# Patient Record
Sex: Male | Born: 1995 | Race: White | Hispanic: No | Marital: Single | Smoking: Never smoker
Health system: Southern US, Community
[De-identification: ages and names within clinical notes are randomized; demographics above are authoritative.]

## PROBLEM LIST (undated history)

## (undated) HISTORY — PX: MANDIBLE SURGERY: SHX707

---
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
## 2020-08-12 ENCOUNTER — Encounter: Payer: Self-pay | Admitting: Physician Assistant

## 2020-09-10 ENCOUNTER — Other Ambulatory Visit: Payer: Self-pay

## 2020-09-10 ENCOUNTER — Ambulatory Visit: Payer: Managed Care, Other (non HMO) | Admitting: Physician Assistant

## 2020-09-10 ENCOUNTER — Encounter: Payer: Self-pay | Admitting: Physician Assistant

## 2020-09-10 VITALS — BP 134/76 | HR 72 | Temp 98.0°F | Resp 15 | Ht 64.0 in | Wt 144.0 lb

## 2020-09-10 DIAGNOSIS — Z008 Encounter for other general examination: Secondary | ICD-10-CM | POA: Diagnosis not present

## 2020-09-10 DIAGNOSIS — Z0000 Encounter for general adult medical examination without abnormal findings: Secondary | ICD-10-CM

## 2020-09-10 NOTE — Progress Notes (Signed)
° °  Subjective:    Patient ID: Joshua Crosby, male    DOB: 10/10/1996, 24 y.o.   MRN: [id number]  Pt is a 24 y/o male presents to clinic for biometric exam. He works with the [employer] he has been working in this position for over 1 year. He denies any medical problem. He exercises regularly. No specific diet plan. No recent illnesses, and no past medical problems. He is up to date on immunizations. He has not taken COVID vaccines. No primary MD. Leanora Ivanoff no meds.     Review of Systems  Constitutional: Negative for activity change, appetite change and fatigue.  HENT: Negative.   Eyes: Negative.   Respiratory: Negative.   Cardiovascular: Negative.   Gastrointestinal: Negative.   Musculoskeletal: Negative.   Neurological: Negative.   Psychiatric/Behavioral: Negative.        Objective:   Physical Exam Constitutional:      General: He is not in acute distress.    Appearance: Normal appearance.  HENT:     Head: Normocephalic.     Right Ear: Tympanic membrane and ear canal normal.     Left Ear: Tympanic membrane and ear canal normal.     Mouth/Throat:     Mouth: Mucous membranes are moist.     Pharynx: Oropharynx is clear.  Eyes:     Conjunctiva/sclera: Conjunctivae normal.     Pupils: Pupils are equal, round, and reactive to light.  Cardiovascular:     Rate and Rhythm: Normal rate and regular rhythm.     Pulses: Normal pulses.     Heart sounds: Normal heart sounds.  Pulmonary:     Effort: Pulmonary effort is normal.     Breath sounds: Normal breath sounds.  Abdominal:     General: Bowel sounds are normal.  Musculoskeletal:        General: Normal range of motion.     Cervical back: Normal range of motion and neck supple. No tenderness.  Skin:    General: Skin is warm and dry.  Neurological:     General: No focal deficit present.     Mental Status: He is alert.  Psychiatric:        Mood and Affect: Mood normal.           Assessment & Plan:

## 2020-09-10 NOTE — Patient Instructions (Signed)
Please return to clinic for biometric exam in 1 year.

## 2020-09-11 LAB — GLUCOSE, RANDOM: Glucose: 95 mg/dL (ref 65–99)

## 2020-09-11 LAB — LIPID PANEL
Chol/HDL Ratio: 3.6 ratio (ref 0.0–5.0)
Cholesterol, Total: 141 mg/dL (ref 100–199)
HDL: 39 mg/dL — ABNORMAL LOW (ref >39)
LDL Chol Calc (NIH): 86 mg/dL (ref 0–99)
Triglycerides: 83 mg/dL (ref 0–149)
VLDL Cholesterol Cal: 16 mg/dL (ref 5–40)

## 2020-10-01 ENCOUNTER — Encounter: Payer: Self-pay | Admitting: Radiology

## 2020-10-01 ENCOUNTER — Other Ambulatory Visit: Payer: Self-pay

## 2020-10-01 ENCOUNTER — Emergency Department
Admission: EM | Admit: 2020-10-01 | Discharge: 2020-10-01 | Disposition: A | Discharge status: Home / Self Care | Payer: No Typology Code available for payment source | Attending: Emergency Medicine | Admitting: Emergency Medicine

## 2020-10-01 ENCOUNTER — Emergency Department: Payer: No Typology Code available for payment source

## 2020-10-01 DIAGNOSIS — S60511A Abrasion of right hand, initial encounter: Secondary | ICD-10-CM | POA: Insufficient documentation

## 2020-10-01 DIAGNOSIS — S7012XA Contusion of left thigh, initial encounter: Secondary | ICD-10-CM | POA: Insufficient documentation

## 2020-10-01 DIAGNOSIS — S60921A Unspecified superficial injury of right hand, initial encounter: Secondary | ICD-10-CM | POA: Diagnosis present

## 2020-10-01 DIAGNOSIS — T1490XA Injury, unspecified, initial encounter: Secondary | ICD-10-CM

## 2020-10-01 DIAGNOSIS — S60512A Abrasion of left hand, initial encounter: Secondary | ICD-10-CM | POA: Diagnosis not present

## 2020-10-01 DIAGNOSIS — S7011XA Contusion of right thigh, initial encounter: Secondary | ICD-10-CM | POA: Insufficient documentation

## 2020-10-01 DIAGNOSIS — R519 Headache, unspecified: Secondary | ICD-10-CM | POA: Diagnosis not present

## 2020-10-01 MED ORDER — IOHEXOL 300 MG/ML  SOLN
100.0000 mL | Freq: Once | INTRAMUSCULAR | Status: AC | PRN
  Administered 2020-10-01: 100 mL via INTRAVENOUS

## 2020-10-01 MED ORDER — IBUPROFEN 800 MG PO TABS
800.0000 mg | ORAL_TABLET | Freq: Once | ORAL | Status: AC
  Administered 2020-10-01: 800 mg via ORAL
  Filled 2020-10-01: qty 1

## 2020-10-01 MED ORDER — IBUPROFEN 800 MG PO TABS: 800.0000 mg | ORAL_TABLET | Freq: Three times a day (TID) | ORAL | 0 refills | Status: AC | PRN

## 2020-10-01 MED ORDER — CYCLOBENZAPRINE HCL 10 MG PO TABS
10.0000 mg | ORAL_TABLET | Freq: Once | ORAL | Status: AC
  Administered 2020-10-01: 10 mg via ORAL
  Filled 2020-10-01: qty 1

## 2020-10-01 MED ORDER — ACETAMINOPHEN 500 MG PO TABS
1000.0000 mg | ORAL_TABLET | Freq: Once | ORAL | Status: AC
  Administered 2020-10-01: 1000 mg via ORAL
  Filled 2020-10-01: qty 2

## 2020-10-01 MED ORDER — IBUPROFEN 600 MG PO TABS: 600.0000 mg | ORAL_TABLET | Freq: Once | ORAL | Status: DC

## 2020-10-01 MED ORDER — CYCLOBENZAPRINE HCL 10 MG PO TABS: 10.0000 mg | ORAL_TABLET | Freq: Three times a day (TID) | ORAL | 0 refills | Status: AC | PRN
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
## 2020-10-01 NOTE — ED Notes (Signed)
Assumed care of pt, pt denies complaints at this time. Stands independently, denies lightheaded or dizziness. AO x4. Talking in full sentences with regular and unlabored breathing
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
## 2021-06-22 ENCOUNTER — Ambulatory Visit: Payer: Managed Care, Other (non HMO) | Admitting: Physician Assistant

## 2021-06-22 ENCOUNTER — Encounter: Payer: Self-pay | Admitting: Physician Assistant

## 2021-06-22 VITALS — BP 122/72 | HR 84 | Temp 98.4°F | Resp 12 | Ht 71.0 in | Wt 145.0 lb

## 2021-06-22 DIAGNOSIS — Z008 Encounter for other general examination: Secondary | ICD-10-CM

## 2021-06-22 DIAGNOSIS — Z0000 Encounter for general adult medical examination without abnormal findings: Secondary | ICD-10-CM

## 2021-06-22 NOTE — Progress Notes (Deleted)
   Subjective:    Patient ID: Joshua Crosby, male    DOB: Jun 10, 1996, 25 y.o.   MRN: [id number]  HPI    Review of Systems     Objective:   Physical Exam        Assessment & Plan:

## 2021-06-22 NOTE — Progress Notes (Addendum)
Subjective:    Patient ID: Joshua Crosby, male    DOB: 1996/09/15, 25 y.o.   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  Enjoys chosen profession Born and raised in this area Graduated from [employer] in 2019.  Has just gone through 2- 3 years of extensive  [place] maxillo-mandibular facial reconstruction. 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 He has done very well though the post operative course was understandably very difficult - he is pleased with the outcome. Has had years of orthodontic preparation prior to reconstructive surgery Has another 4-5 months of full braces and will be able to get to a regular diet.  Excited about a real steak dinner ! Has multiple facial plates and screws that will be left in situ by his report. Sings praises of the medical surgical teams at [facility] who were very supportive as well as capable, professional , personable.  Has not had Covid vaccines, doesn't plan to Has not had flu vaccines by history  Does not have a PCP- encouraged to check with family about affiliation and then to make an association at office of choice for future care.  Review of Systems Has 2 younger brothers 76 and 79 - no others with similar concerns   Denies allergies Objective:   Physical Exam Nursing note (98.4    12   84) reviewed.  Constitutional:      Appearance: Normal appearance. He is normal weight.     Comments: Currently thin, looking forward to returning to regular diet No visual clues to the extensive surgery he has been through- smile and naso labial folds are symmetric- obviously pleased with results after long process.  HENT:     Head: Normocephalic and atraumatic.     Right Ear: Tympanic membrane, ear canal and external ear normal.     Left Ear: Tympanic membrane, ear canal and external ear normal.     Ears:      Comments: Uses Qtips regularly    Nose: Nose normal.     Mouth/Throat:     Mouth: Mucous membranes are moist.     Comments: Full braces up/down Eyes:     Extraocular Movements: Extraocular movements intact.     Conjunctiva/sclera: Conjunctivae normal.  Cardiovascular:     Rate and Rhythm: Normal rate and regular rhythm.     Pulses: Normal pulses.  Pulmonary:     Effort: Pulmonary effort is normal.     Breath sounds: Normal breath sounds.  Abdominal:     General: Abdomen is flat.     Palpations: Abdomen is soft.  Genitourinary:    Comments: defer Musculoskeletal:        General: Normal range of motion.     Cervical back: Normal range of motion and neck supple.  Skin:    General: Skin is warm and dry.     Capillary Refill: Capillary refill takes less than 2 seconds.  Neurological:     Mental Status: He is alert and oriented to person, place, and time. Mental status is at baseline.     Cranial Nerves: No cranial nerve deficit.     Deep Tendon Reflexes: Reflexes normal.     Comments: Some minimal numbness persists peri-orally , left Function is grossly symmetrical  Psychiatric:        Mood and Affect: Mood normal.  Behavior: Behavior normal.      Assessment & Plan:  Biometric screening and labs- results pending- will be reported on MyChart Pt reports active Questions fielded Does not appear to have tetanus vaccine hx- check outside records   07/17/21 My Chart was pending- not active Many attempts to reach him by phone have been unsuccessful His mailbox is full and phone can't take message Have sent a letter reviewing lab findings and making dietary change suggestions. Has been encouraged to establish with PCP as clinic cannot manage chronic care [facility] PA-C

## 2021-06-23 LAB — LIPID PANEL
Chol/HDL Ratio: 3.9 ratio (ref 0.0–5.0)
Cholesterol, Total: 182 mg/dL (ref 100–199)
HDL: 47 mg/dL (ref >39)
LDL Chol Calc (NIH): 119 mg/dL — ABNORMAL HIGH (ref 0–99)
Triglycerides: 86 mg/dL (ref 0–149)
VLDL Cholesterol Cal: 16 mg/dL (ref 5–40)

## 2021-06-23 LAB — GLUCOSE, RANDOM: Glucose: 95 mg/dL (ref 65–99)
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
## 2023-06-30 ENCOUNTER — Ambulatory Visit
Admission: EM | Admit: 2023-06-30 | Discharge: 2023-06-30 | Disposition: A | Discharge status: Home / Self Care | Payer: Managed Care, Other (non HMO) | Attending: Emergency Medicine | Admitting: Emergency Medicine

## 2023-06-30 DIAGNOSIS — T63481A Toxic effect of venom of other arthropod, accidental (unintentional), initial encounter: Secondary | ICD-10-CM

## 2023-06-30 DIAGNOSIS — M25471 Effusion, right ankle: Secondary | ICD-10-CM | POA: Diagnosis not present

## 2023-06-30 DIAGNOSIS — T7840XA Allergy, unspecified, initial encounter: Secondary | ICD-10-CM | POA: Diagnosis not present

## 2023-06-30 MED ORDER — PREDNISONE 10 MG (21) PO TBPK: ORAL_TABLET | Freq: Every day | ORAL | 0 refills | Status: AC

## 2023-06-30 NOTE — ED Provider Notes (Signed)
Joshua Crosby    CSN: [id number] Arrival date & time: 06/30/23  0858      History   Chief Complaint Chief Complaint  Patient presents with   Insect Bite    HPI Joshua Crosby is a 27 y.o. male.  Patient presents with a hornet sting on his right anterior ankle that occurred 4 days ago.  The area is red and swollen but has improved today.  No OTC medications taken today but he took Benadryl last night.  No difficulty swallowing or breathing.  No fever, chills, or other symptoms.  No pertinent medical history.  The history is provided by the patient and medical records.    History reviewed. No pertinent past medical history.  There are no problems to display for this patient.   Past Surgical History:  Procedure Laterality Date   MANDIBLE SURGERY         Home Medications    Prior to Admission medications   Medication Sig Start Date End Date Taking? Authorizing Provider  predniSONE (STERAPRED UNI-PAK 21 TAB) 10 MG (21) TBPK tablet Take by mouth daily. As directed 06/30/23  Yes Mickie Bail, NP  cyclobenzaprine (FLEXERIL) 10 MG tablet Take 1 tablet (10 mg total) by mouth 3 (three) times daily as needed for muscle spasms. Patient not taking: Reported on 06/22/2021 10/01/20   Nita Sickle, MD  ibuprofen (ADVIL) 800 MG tablet Take 1 tablet (800 mg total) by mouth every 8 (eight) hours as needed. Patient not taking: Reported on 06/30/2023 10/01/20   Nita Sickle, MD    Family History History reviewed. No pertinent family history.  Social History Social History   Tobacco Use   Smoking status: Never   Smokeless tobacco: Never  Vaping Use   Vaping Use: Never used  Substance Use Topics   Alcohol use: Yes   Drug use: Never     Allergies   Patient has no known allergies.   Review of Systems Review of Systems  Constitutional:  Negative for chills and fever.  HENT:  Negative for sore throat, trouble swallowing and voice change.   Respiratory:  Negative  for cough and shortness of breath.   Cardiovascular:  Negative for chest pain and palpitations.  Musculoskeletal:  Positive for joint swelling. Negative for arthralgias.  Skin:  Positive for color change and wound.  Neurological:  Negative for weakness and numbness.  All other systems reviewed and are negative.    Physical Exam Triage Vital Signs ED Triage Vitals  Enc Vitals Group     BP --      Pulse Rate 06/30/23 0904 90     Resp 06/30/23 0904 18     Temp 06/30/23 0904 97.7 F (36.5 C)     Temp src --      SpO2 06/30/23 0904 97 %     Weight --      Height --      Head Circumference --      Peak Flow --      Pain Score 06/30/23 0916 0     Pain Loc --      Pain Edu? --      Excl. in GC? --    No data found.  Updated Vital Signs BP 117/73   Pulse 90   Temp 97.7 F (36.5 C)   Resp 18   SpO2 97%   Visual Acuity Right Eye Distance:   Left Eye Distance:   Bilateral Distance:    Right Eye  Near:   Left Eye Near:    Bilateral Near:     Physical Exam Vitals and nursing note reviewed.  Constitutional:      General: He is not in acute distress.    Appearance: He is well-developed.  HENT:     Mouth/Throat:     Mouth: Mucous membranes are moist.     Pharynx: Oropharynx is clear.  Cardiovascular:     Rate and Rhythm: Normal rate and regular rhythm.     Heart sounds: Normal heart sounds.  Pulmonary:     Effort: Pulmonary effort is normal. No respiratory distress.     Breath sounds: Normal breath sounds.  Musculoskeletal:        General: Swelling present. No deformity. Normal range of motion.     Cervical back: Neck supple.       Feet:  Skin:    General: Skin is warm and dry.     Capillary Refill: Capillary refill takes less than 2 seconds.     Findings: Erythema and lesion present.  Neurological:     General: No focal deficit present.     Mental Status: He is alert and oriented to person, place, and time.     Sensory: No sensory deficit.     Motor: No  weakness.     Gait: Gait normal.  Psychiatric:        Mood and Affect: Mood normal.        Behavior: Behavior normal.      UC Treatments / Results  Labs (all labs ordered are listed, but only abnormal results are displayed) Labs Reviewed - No data to display  EKG   Radiology No results found.  Procedures Procedures (including critical care time)  Medications Ordered in UC Medications - No data to display  Initial Impression / Assessment and Plan / UC Course  I have reviewed the triage vital signs and the nursing notes.  Pertinent labs & imaging results that were available during my care of the patient were reviewed by me and considered in my medical decision making (see chart for details).    Insect sting of right ankle with swelling and localized allergic reaction.  Treating with Zyrtec and prednisone taper.  Education provided on bee sting.  Discussed elevation to reduce swelling.  Tylenol or ibuprofen as needed for discomfort.  Instructed him to follow-up with his PCP if he is not improving.  ED precautions given.  Patient agrees to plan of care.  Final Clinical Impressions(s) / UC Diagnoses   Final diagnoses:  Insect stings, accidental or unintentional, initial encounter  Right ankle swelling  Allergic reaction, initial encounter     Discharge Instructions      Take the prednisone and Zyrtec as directed.  Follow up with your primary care provider if your symptoms are not improving.        ED Prescriptions     Medication Sig Dispense Auth. Provider   predniSONE (STERAPRED UNI-PAK 21 TAB) 10 MG (21) TBPK tablet Take by mouth daily. As directed 21 tablet Mickie Bail, NP      PDMP not reviewed this encounter.   Mickie Bail, NP 06/30/23 [phone]

## 2023-06-30 NOTE — Discharge Instructions (Addendum)
Take the prednisone and Zyrtec as directed.  Follow up with your primary care provider if your symptoms are not improving.    

## 2023-06-30 NOTE — ED Triage Notes (Signed)
Patient to Urgent Care with complaints of right sided ankle pain following a hornet sting that occurred four days ago. Swelling/ redness from ankle into foot.

## 2024-11-10 ENCOUNTER — Ambulatory Visit
Admission: EM | Admit: 2024-11-10 | Discharge: 2024-11-10 | Disposition: A | Discharge status: Home / Self Care | Attending: Emergency Medicine | Admitting: Emergency Medicine

## 2024-11-10 DIAGNOSIS — J0100 Acute maxillary sinusitis, unspecified: Secondary | ICD-10-CM

## 2024-11-10 MED ORDER — AMOXICILLIN-POT CLAVULANATE 875-125 MG PO TABS: 1.0000 | ORAL_TABLET | Freq: Two times a day (BID) | ORAL | 0 refills | Status: AC

## 2024-11-10 NOTE — Discharge Instructions (Signed)
 Take the Augmentin as directed.  Follow up with your primary care provider if your symptoms are not improving.

## 2024-11-10 NOTE — ED Triage Notes (Signed)
 Patient to Urgent Care with complaints of cough/ nasal congestion  Symptoms x2 weeks.  Attempted multiple otc meds without relief.

## 2024-11-10 NOTE — ED Provider Notes (Signed)
 CAY RALPH PELT    CSN: [id number] Arrival date & time: 11/10/24  1141      History   Chief Complaint Chief Complaint  Patient presents with   Nasal Congestion   Cough    HPI Joshua Crosby is a 28 y.o. male.  Patient presents with a 2-week history of sinus pressure, congestion, cough.  No fever, shortness of breath, vomiting, diarrhea.  Patient has attempted treatment with several OTC cold medications without relief.  The history is provided by the patient and medical records.    History reviewed. No pertinent past medical history.  There are no active problems to display for this patient.   Past Surgical History:  Procedure Laterality Date   MANDIBLE SURGERY         Home Medications    Prior to Admission medications   Medication Sig Start Date End Date Taking? Authorizing Provider  amoxicillin-clavulanate (AUGMENTIN) 875-125 MG tablet Take 1 tablet by mouth every 12 (twelve) hours. 11/10/24  Yes Corlis Burnard DEL, NP  cyclobenzaprine  (FLEXERIL ) 10 MG tablet Take 1 tablet (10 mg total) by mouth 3 (three) times daily as needed for muscle spasms. Patient not taking: Reported on 06/22/2021 10/01/20   Edelmiro Leash, MD  ibuprofen  (ADVIL ) 800 MG tablet Take 1 tablet (800 mg total) by mouth every 8 (eight) hours as needed. Patient not taking: Reported on 06/30/2023 10/01/20   Edelmiro Leash, MD  predniSONE  (STERAPRED UNI-PAK 21 TAB) 10 MG (21) TBPK tablet Take by mouth daily. As directed Patient not taking: Reported on 11/10/2024 06/30/23   Corlis Burnard DEL, NP    Family History History reviewed. No pertinent family history.  Social History Social History   Tobacco Use   Smoking status: Never   Smokeless tobacco: Never  Vaping Use   Vaping status: Never Used  Substance Use Topics   Alcohol use: Not Currently   Drug use: Never     Allergies   Patient has no known allergies.   Review of Systems Review of Systems  Constitutional:  Negative for chills  and fever.  HENT:  Positive for congestion, postnasal drip and sinus pressure. Negative for ear pain and sore throat.   Respiratory:  Positive for cough. Negative for shortness of breath.      Physical Exam Triage Vital Signs ED Triage Vitals  Encounter Vitals Group     BP 11/10/24 1152 137/85     Girls Systolic BP Percentile --      Girls Diastolic BP Percentile --      Boys Systolic BP Percentile --      Boys Diastolic BP Percentile --      Pulse Rate 11/10/24 1152 87     Resp 11/10/24 1152 18     Temp 11/10/24 1152 97.9 F (36.6 C)     Temp src --      SpO2 11/10/24 1152 98 %     Weight --      Height --      Head Circumference --      Peak Flow --      Pain Score 11/10/24 1201 0     Pain Loc --      Pain Education --      Exclude from Growth Chart --    No data found.  Updated Vital Signs BP 137/85   Pulse 87   Temp 97.9 F (36.6 C)   Resp 18   SpO2 98%   Visual Acuity Right Eye Distance:  Left Eye Distance:   Bilateral Distance:    Right Eye Near:   Left Eye Near:    Bilateral Near:     Physical Exam Constitutional:      General: He is not in acute distress. HENT:     Right Ear: Tympanic membrane normal.     Left Ear: Tympanic membrane normal.     Nose: Congestion present.     Mouth/Throat:     Mouth: Mucous membranes are moist.     Pharynx: Oropharynx is clear.  Cardiovascular:     Rate and Rhythm: Normal rate and regular rhythm.     Heart sounds: Normal heart sounds.  Pulmonary:     Effort: Pulmonary effort is normal. No respiratory distress.     Breath sounds: Normal breath sounds.  Neurological:     Mental Status: He is alert.      UC Treatments / Results  Labs (all labs ordered are listed, but only abnormal results are displayed) Labs Reviewed - No data to display  EKG   Radiology No results found.  Procedures Procedures (including critical care time)  Medications Ordered in UC Medications - No data to display  Initial  Impression / Assessment and Plan / UC Course  I have reviewed the triage vital signs and the nursing notes.  Pertinent labs & imaging results that were available during my care of the patient were reviewed by me and considered in my medical decision making (see chart for details).    Acute sinusitis.  Afebrile and vital signs are stable.  Lungs are clear and O2 sat is 98% on room air.  Patient is been symptomatic for 2 weeks and is not improving with OTC treatment.  Treating today with Augmentin.  Education provided on sinus infection.  Instructed him to follow-up with his PCP if he is not improving.  He agrees to plan of care.  Final Clinical Impressions(s) / UC Diagnoses   Final diagnoses:  Acute non-recurrent maxillary sinusitis     Discharge Instructions      Take the Augmentin as directed.  Follow-up with your primary care provider if your symptoms are not improving.      ED Prescriptions     Medication Sig Dispense Auth. Provider   amoxicillin-clavulanate (AUGMENTIN) 875-125 MG tablet Take 1 tablet by mouth every 12 (twelve) hours. 14 tablet Corlis Burnard DEL, NP      PDMP not reviewed this encounter.   Corlis Burnard DEL, NP 11/10/24 [phone]
